# Patient Record
Sex: Female | Born: 1985 | Race: White | Hispanic: Yes | Marital: Married
Health system: Southern US, Community
[De-identification: ages and names within clinical notes are randomized; demographics above are authoritative.]

## PROBLEM LIST (undated history)

## (undated) DIAGNOSIS — F419 Anxiety disorder, unspecified: Secondary | ICD-10-CM

## (undated) DIAGNOSIS — D649 Anemia, unspecified: Secondary | ICD-10-CM

---

## 2007-12-19 ENCOUNTER — Inpatient Hospital Stay: Payer: Self-pay | Admitting: Obstetrics and Gynecology

## 2008-10-06 ENCOUNTER — Ambulatory Visit: Payer: Self-pay | Admitting: Family Medicine

## 2010-10-31 ENCOUNTER — Ambulatory Visit: Payer: Self-pay | Admitting: Obstetrics and Gynecology

## 2010-11-14 ENCOUNTER — Ambulatory Visit: Payer: Self-pay | Admitting: Surgery

## 2011-03-12 ENCOUNTER — Ambulatory Visit: Payer: Self-pay | Admitting: Advanced Practice Midwife

## 2011-04-03 ENCOUNTER — Encounter: Payer: Self-pay | Admitting: Family Medicine

## 2011-04-15 ENCOUNTER — Encounter: Payer: Self-pay | Admitting: Family Medicine

## 2011-05-01 ENCOUNTER — Observation Stay: Payer: Self-pay | Admitting: Obstetrics and Gynecology

## 2011-05-16 ENCOUNTER — Encounter: Payer: Self-pay | Admitting: Family Medicine

## 2011-08-27 ENCOUNTER — Inpatient Hospital Stay: Payer: Self-pay

## 2014-11-28 ENCOUNTER — Ambulatory Visit: Payer: Self-pay

## 2015-03-30 ENCOUNTER — Other Ambulatory Visit: Payer: Self-pay | Admitting: Certified Nurse Midwife

## 2015-03-30 DIAGNOSIS — N63 Unspecified lump in unspecified breast: Secondary | ICD-10-CM

## 2015-04-05 ENCOUNTER — Ambulatory Visit
Admit: 2015-04-05 | Disposition: A | Discharge status: Home / Self Care | Payer: Self-pay | Attending: Allergy | Admitting: Allergy

## 2015-05-31 ENCOUNTER — Ambulatory Visit: Payer: Self-pay

## 2015-12-28 ENCOUNTER — Encounter: Payer: Self-pay | Admitting: Physician Assistant

## 2015-12-28 ENCOUNTER — Ambulatory Visit: Payer: Self-pay | Admitting: Physician Assistant

## 2015-12-28 VITALS — BP 100/60 | HR 76 | Temp 98.4°F

## 2015-12-28 DIAGNOSIS — B80 Enterobiasis: Secondary | ICD-10-CM

## 2015-12-28 MED ORDER — ALBENDAZOLE 200 MG PO TABS: 400.0000 mg | ORAL_TABLET | Freq: Two times a day (BID) | ORAL | Status: DC

## 2015-12-28 NOTE — Progress Notes (Signed)
S: has anal itching, used a mirror and noticed a very small white worm, looked it up on internet and knows its a pinworm, children are not symptomatic at this time, denies abd pain or fever  O: vitals wnl, lungs c t a, cv rrr, rectal exam deferred by pt  A: pinworm infection by hx  P: albendazole 400mg ; rx for her husband Laurann [place] Lec[place]er also given, told her to have pediatrician issure rx for her children or try otc med; instructions on how to check stool for pinworms given

## 2016-01-15 ENCOUNTER — Other Ambulatory Visit: Payer: Self-pay | Admitting: Obstetrics and Gynecology

## 2016-01-15 DIAGNOSIS — N6310 Unspecified lump in the right breast, unspecified quadrant: Secondary | ICD-10-CM

## 2016-01-30 ENCOUNTER — Ambulatory Visit
Admission: RE | Admit: 2016-01-30 | Discharge: 2016-01-30 | Disposition: A | Discharge status: Home / Self Care | Payer: Managed Care, Other (non HMO) | Source: Ambulatory Visit | Attending: Obstetrics and Gynecology | Admitting: Obstetrics and Gynecology

## 2016-01-30 ENCOUNTER — Other Ambulatory Visit: Payer: Self-pay | Admitting: Obstetrics and Gynecology

## 2016-01-30 DIAGNOSIS — N6310 Unspecified lump in the right breast, unspecified quadrant: Secondary | ICD-10-CM

## 2016-01-30 DIAGNOSIS — R928 Other abnormal and inconclusive findings on diagnostic imaging of breast: Secondary | ICD-10-CM | POA: Insufficient documentation

## 2016-01-30 DIAGNOSIS — N63 Unspecified lump in breast: Secondary | ICD-10-CM | POA: Diagnosis present

## 2016-07-31 IMAGING — US US BREAST*L* LIMITED INC AXILLA
1 series · 4 of 4 positions shown · non-contrast
Comparison: 11/14/2010

CLINICAL DATA: 28-year-old female for follow-up of previously seen
right breast nodule at 10 o'clock. Surgical excision of the nodule
was recommended; however, at surgical consultation, the mass was
felt to most likely represent a fibroadenoma and follow-up was
recommended. The patient did not return for follow-up. A new nodule
is now felt by the patient's physician in the left breast at 12-2
o'clock.

EXAM:
DIGITAL DIAGNOSTIC  BILATERAL MAMMOGRAM WITH CAD
ULTRASOUND BILATERAL BREAST

[Series 1: us breast*left* limited inc axilla · 0.08mm/px · 4 of 4 slices shown]
[im 1/4]
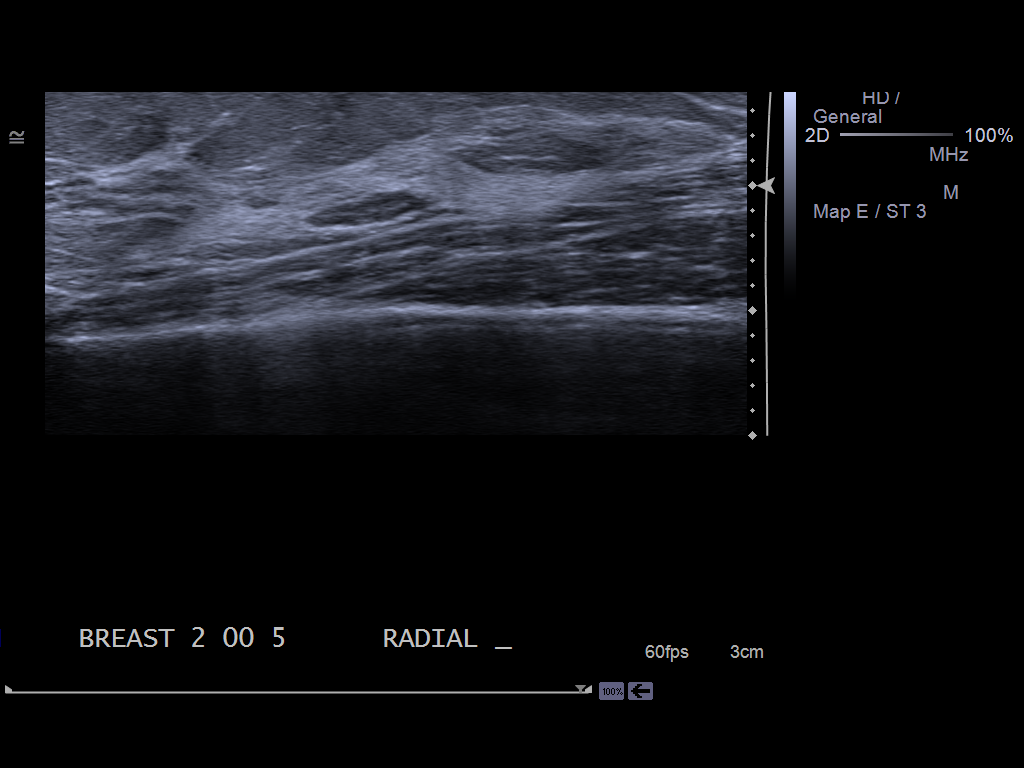
[im 2/4]
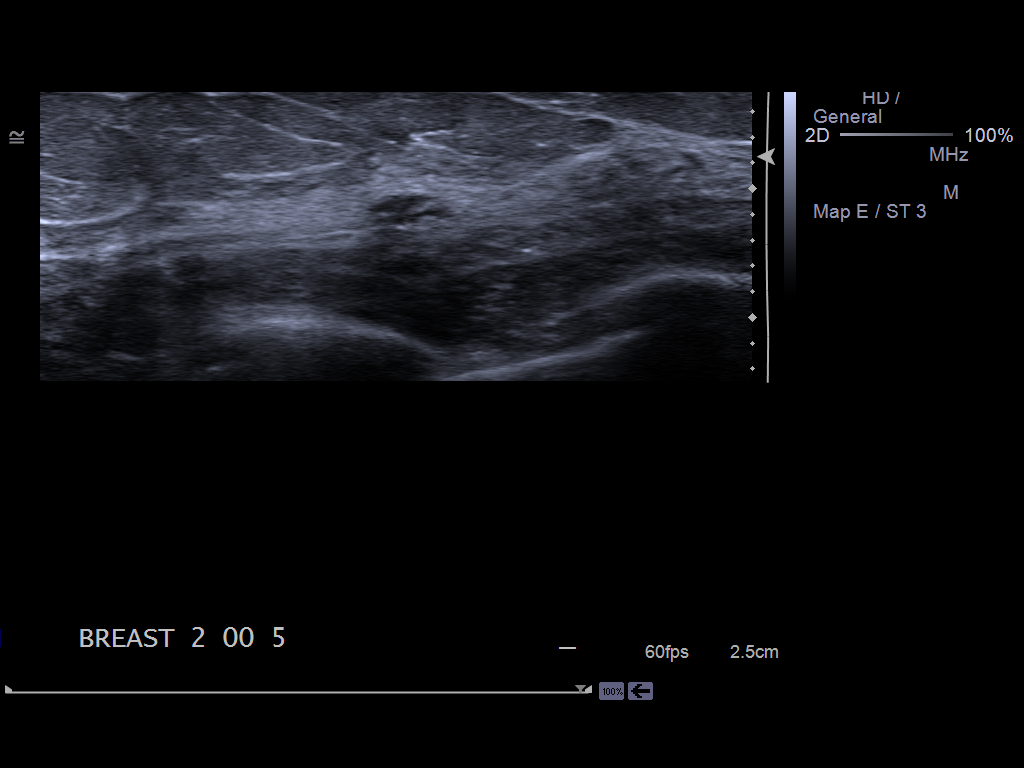
[im 3/4]
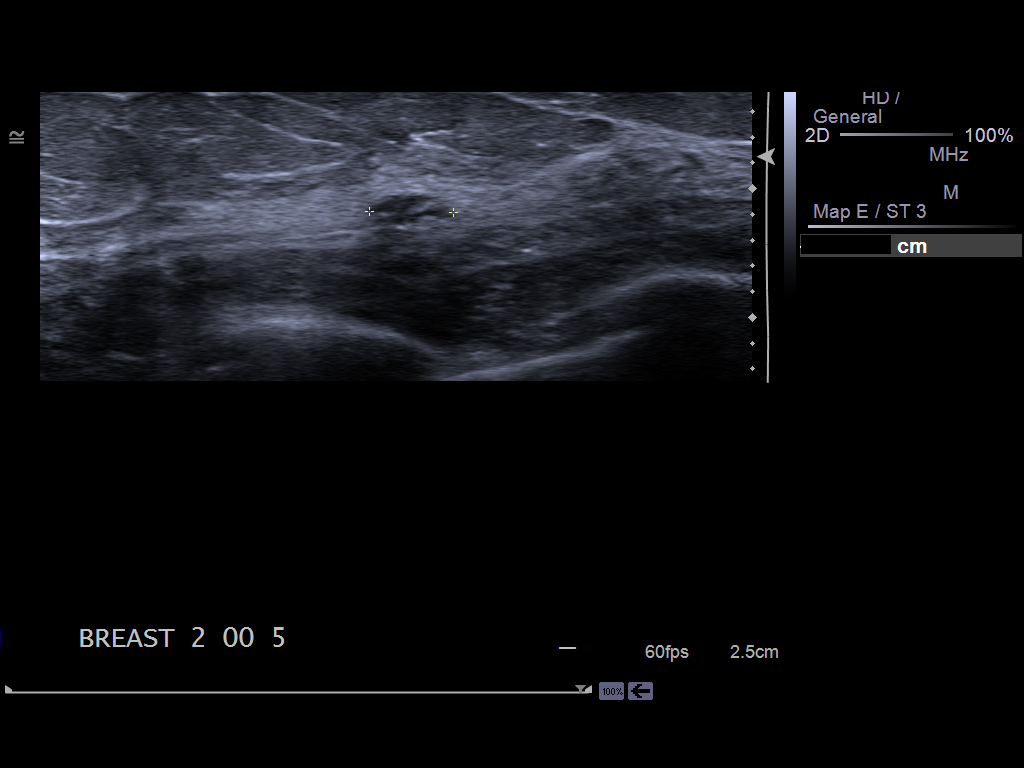
[im 4/4]
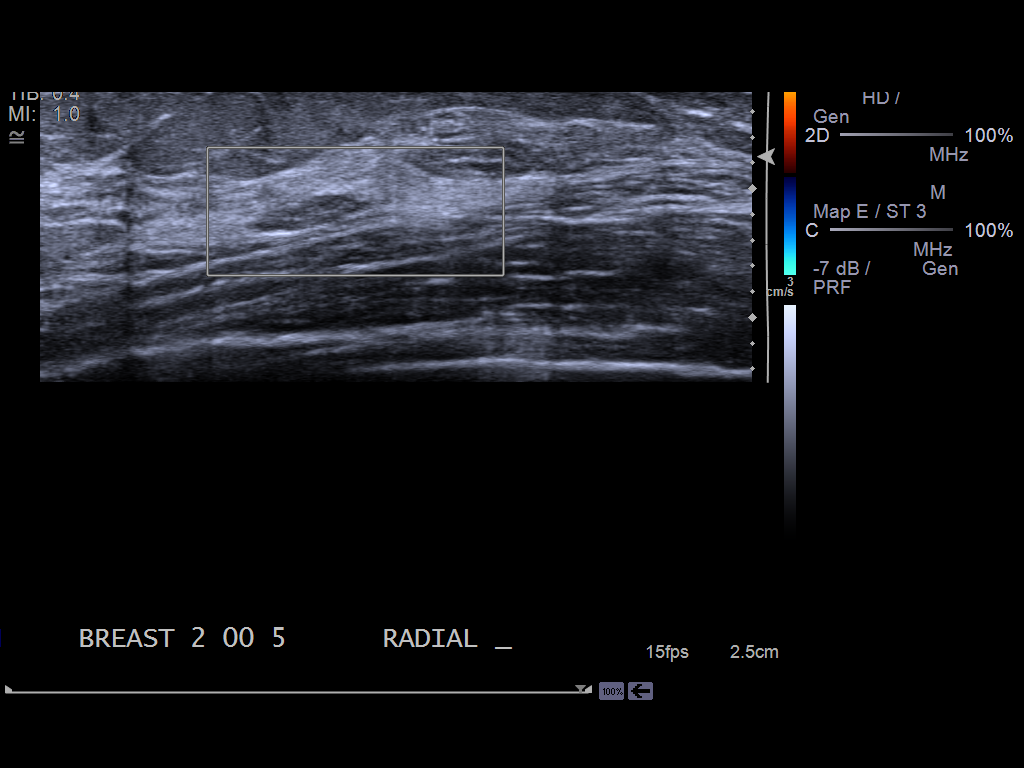

[4 of 4 positions shown; findings below may reference images not displayed]

ACR Breast Density Category c: The breast tissue is heterogeneously
dense, which may obscure small masses.
FINDINGS: No suspicious mass, calcifications, or other abnormality is
identified on mammogram.

Mammographic images were processed with CAD.

On physical exam, no discrete mass is felt within the upper, outer
quadrants of either breast.

Targeted ultrasound of the right breast demonstrates an oval,
circumscribed mass at 11 o'clock, 3 cm from the nipple measuring 9 x
4 x 8 mm. This mass likely represents the previously identified mass
at 10 o'clock; however, no distance from the nipple was noted on
prior ultrasound and no images labeled antiradial were obtained. No
additional mass was identified in this region.

Targeted ultrasound of the left breast from 12-2 o'clock
demonstrates an oval, hypoechoic, circumscribed mass at 2 o'clock, 5
cm from the nipple measuring 9 x 3 x 7 mm. No internal vascularity
is identified. This finding is likely incidental. No suspicious
sonographic finding was identified in this region.
IMPRESSION: Probably benign bilateral breast findings.

RECOMMENDATION:
Bilateral ultrasound in 6 months.

I have discussed the findings and recommendations with the patient.
Results were also provided in writing at the conclusion of the
visit. If applicable, a reminder letter will be sent to the patient
regarding the next appointment.

BI-RADS CATEGORY  3: Probably benign.

## 2016-07-31 IMAGING — US US BREAST*R* LIMITED INC AXILLA
1 series · 4 of 4 positions shown · non-contrast
Comparison: 11/14/2010

CLINICAL DATA: 28-year-old female for follow-up of previously seen
right breast nodule at 10 o'clock. Surgical excision of the nodule
was recommended; however, at surgical consultation, the mass was
felt to most likely represent a fibroadenoma and follow-up was
recommended. The patient did not return for follow-up. A new nodule
is now felt by the patient's physician in the left breast at 12-2
o'clock.

EXAM:
DIGITAL DIAGNOSTIC  BILATERAL MAMMOGRAM WITH CAD
ULTRASOUND BILATERAL BREAST

[Series 1: us breast*right* limited inc axilla · 0.08mm/px · 4 of 4 slices shown]
[im 1/4]
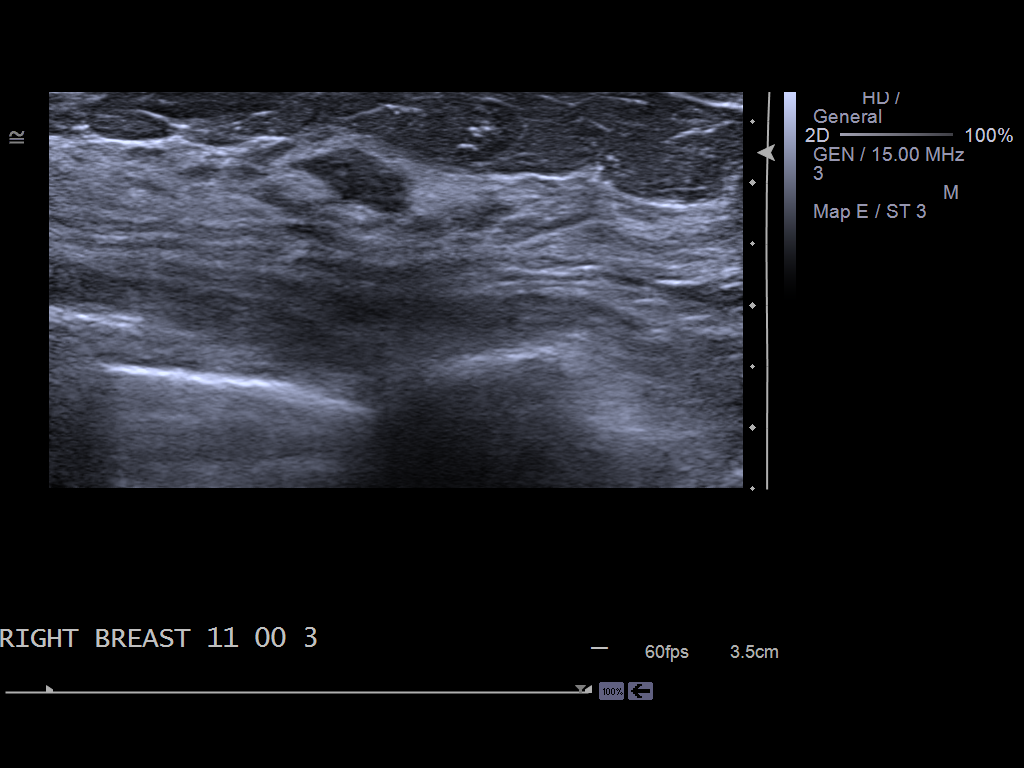
[im 2/4]
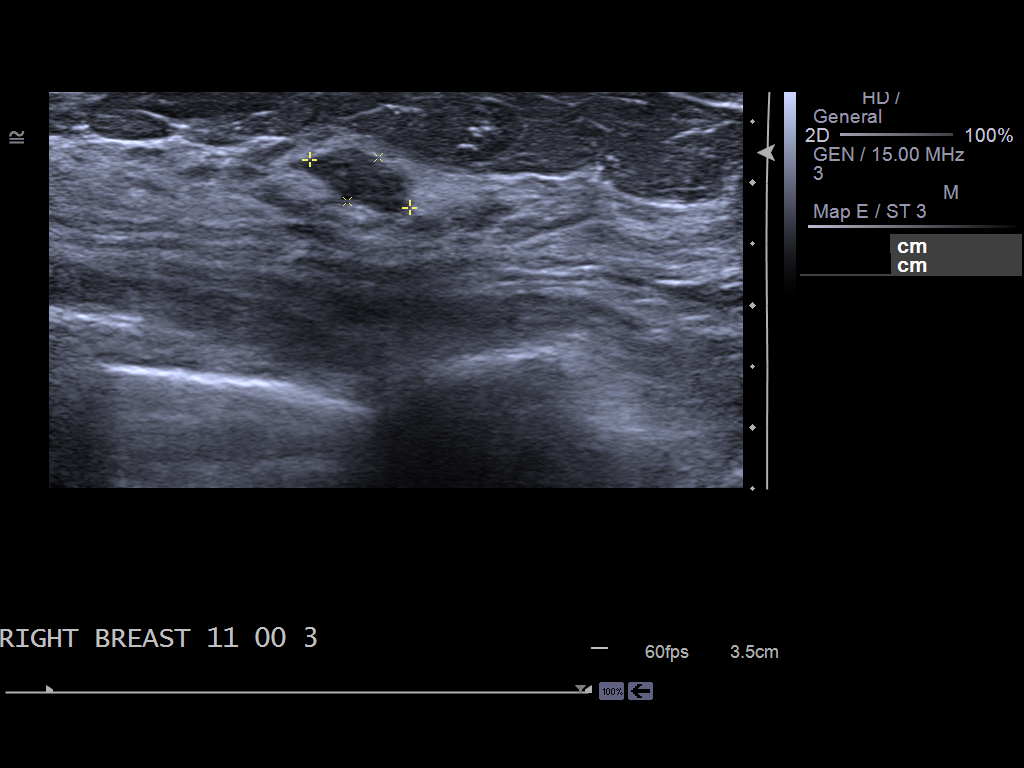
[im 3/4]
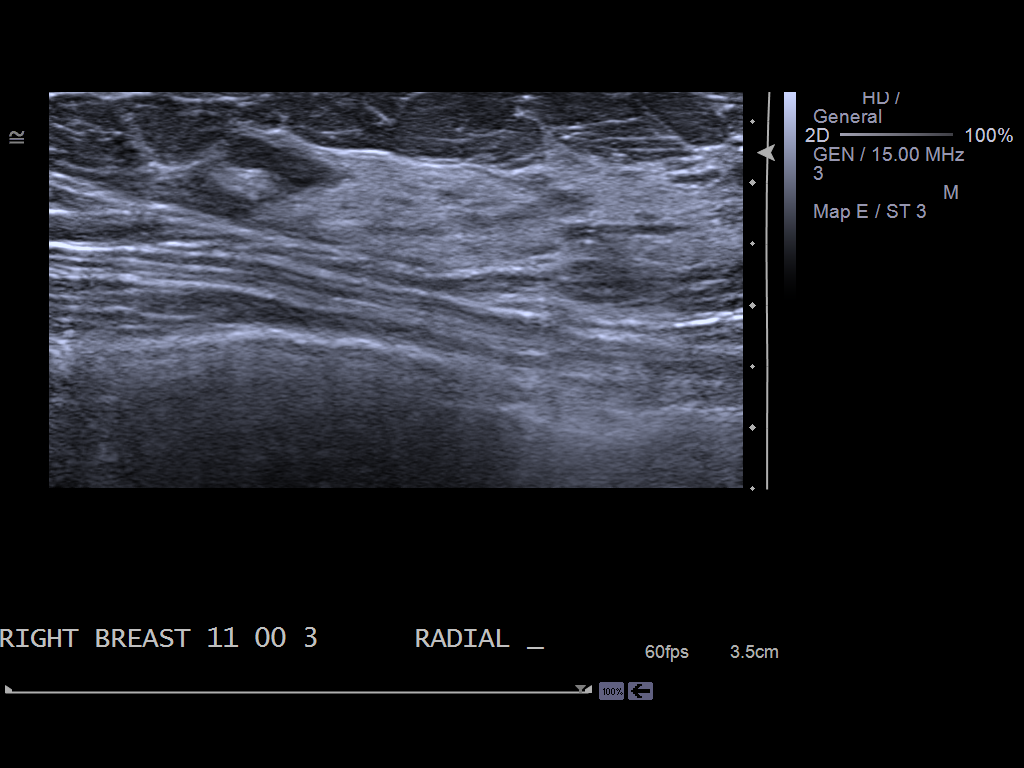
[im 4/4]
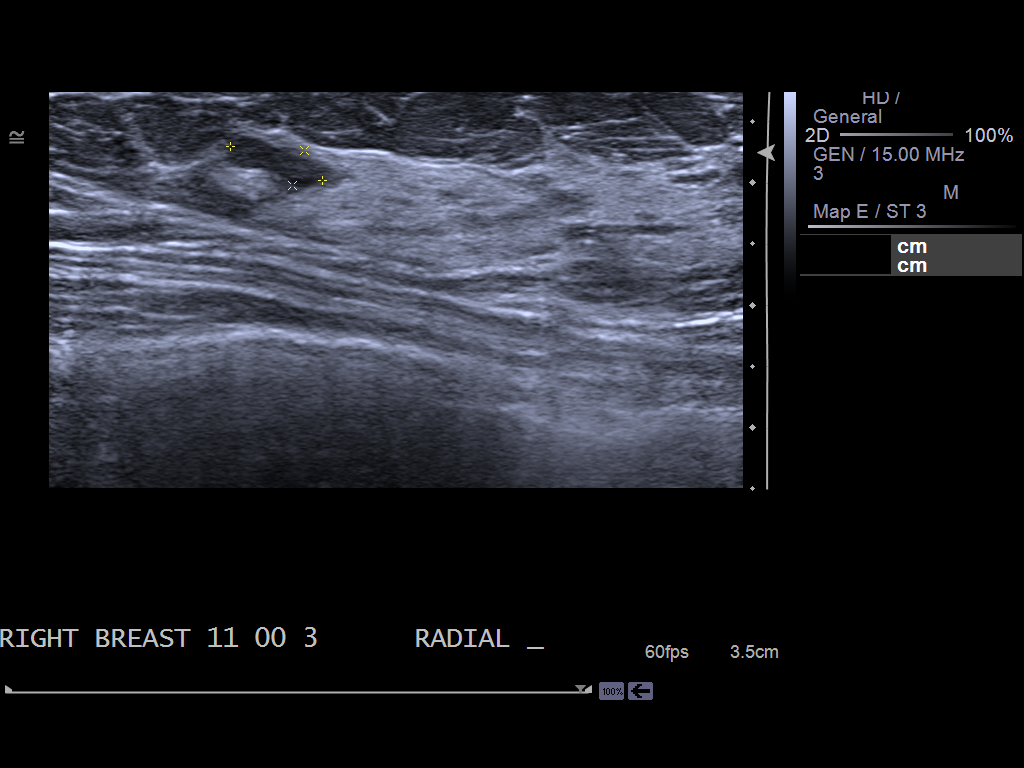

[4 of 4 positions shown; findings below may reference images not displayed]

ACR Breast Density Category c: The breast tissue is heterogeneously
dense, which may obscure small masses.
FINDINGS: No suspicious mass, calcifications, or other abnormality is
identified on mammogram.

Mammographic images were processed with CAD.

On physical exam, no discrete mass is felt within the upper, outer
quadrants of either breast.

Targeted ultrasound of the right breast demonstrates an oval,
circumscribed mass at 11 o'clock, 3 cm from the nipple measuring 9 x
4 x 8 mm. This mass likely represents the previously identified mass
at 10 o'clock; however, no distance from the nipple was noted on
prior ultrasound and no images labeled antiradial were obtained. No
additional mass was identified in this region.

Targeted ultrasound of the left breast from 12-2 o'clock
demonstrates an oval, hypoechoic, circumscribed mass at 2 o'clock, 5
cm from the nipple measuring 9 x 3 x 7 mm. No internal vascularity
is identified. This finding is likely incidental. No suspicious
sonographic finding was identified in this region.
IMPRESSION: Probably benign bilateral breast findings.

RECOMMENDATION:
Bilateral ultrasound in 6 months.

I have discussed the findings and recommendations with the patient.
Results were also provided in writing at the conclusion of the
visit. If applicable, a reminder letter will be sent to the patient
regarding the next appointment.

BI-RADS CATEGORY  3: Probably benign.

## 2016-08-07 ENCOUNTER — Encounter: Payer: Self-pay | Admitting: Physician Assistant

## 2016-08-07 ENCOUNTER — Ambulatory Visit: Payer: Self-pay | Admitting: Physician Assistant

## 2016-08-07 VITALS — BP 94/70 | HR 78 | Temp 98.6°F

## 2016-08-07 DIAGNOSIS — R519 Headache, unspecified: Secondary | ICD-10-CM

## 2016-08-07 DIAGNOSIS — J0100 Acute maxillary sinusitis, unspecified: Secondary | ICD-10-CM

## 2016-08-07 DIAGNOSIS — R51 Headache: Secondary | ICD-10-CM

## 2016-08-07 MED ORDER — AMOXICILLIN 875 MG PO TABS: 875.0000 mg | ORAL_TABLET | Freq: Two times a day (BID) | ORAL | 0 refills | Status: DC

## 2016-08-07 NOTE — Progress Notes (Signed)
S: C/o runny nose and congestion for 3 days, ? fever, chills last nigh, no  cp/sob, v/d; mucus is yellow and thick, cough is sporadic, c/o of facial and dental pain. C/o headache all of her head, no known tick bites, states headaches come and go x 3 months  Using otc meds:   O: PE: vitals wnl, nad, perrl eomi, normocephalic, tms dull, nasal mucosa red and swollen, throat injected, neck supple no lymph, lungs c t a, cv rrr, neuro intact  A:  Acute sinusitis, headache   P: drink fluids, continue regular meds , use otc meds of choice, return if not improving in 5 days, return earlier if worsening, amoxil 875mg  bid x 10d, if headaches continue can refer to neurology

## 2017-04-03 ENCOUNTER — Ambulatory Visit: Payer: Self-pay | Admitting: Physician Assistant

## 2017-04-03 ENCOUNTER — Encounter: Payer: Self-pay | Admitting: Physician Assistant

## 2017-04-03 VITALS — BP 110/70 | HR 100 | Temp 98.7°F

## 2017-04-03 DIAGNOSIS — J020 Streptococcal pharyngitis: Secondary | ICD-10-CM

## 2017-04-03 LAB — POCT RAPID STREP A (OFFICE): RAPID STREP A SCREEN: POSITIVE — AB

## 2017-04-03 MED ORDER — AMOXICILLIN 400 MG/5ML PO SUSR: ORAL | 0 refills | Status: DC

## 2017-04-03 MED ORDER — DEXAMETHASONE SODIUM PHOSPHATE 10 MG/ML IJ SOLN
10.0000 mg | Freq: Once | INTRAMUSCULAR | Status: AC
  Administered 2017-04-03: 10 mg via INTRAMUSCULAR

## 2017-04-03 NOTE — Progress Notes (Signed)
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

## 2017-04-06 ENCOUNTER — Encounter: Payer: Self-pay | Admitting: Physician Assistant

## 2017-04-06 ENCOUNTER — Ambulatory Visit: Payer: Self-pay | Admitting: Physician Assistant

## 2017-04-06 VITALS — BP 110/70 | Temp 98.7°F

## 2017-04-06 DIAGNOSIS — K1379 Other lesions of oral mucosa: Secondary | ICD-10-CM

## 2017-04-06 NOTE — Progress Notes (Signed)
S: pt had +strep test on Friday, given decadron  IM and liquid amoxicillin, states she does feel better but it still hurts to swallow, no fever/chills, states her uvula doesn't feel right  O: vitals wnl, nad, tms clear, throat with some exudate and uvula has dark area distally, neck supple no lymph, lungs c t a, cv rrr, pt has difficulty swallowing  A: necrosis of uvula  P: called ENT, [place] office said to make appointment with [place], when I asked to speak to a provider the front desk Neysa Bonito)  said to just call [place], sent a photo to Dr Sullivan Lone, he agreed area is necrotic, he tried to call ENT but unable to speak with anyone at this time, pt is to go to ENT for 345 appointment in [facility] with DR Irving Shows, if by chance we get in touch with a provider and they recommend IV antibiotics and steroids we will call the patient and set this up, she had plenty of airway upon discharge and is to go to ER if worsening prior to appointment

## 2017-12-04 ENCOUNTER — Ambulatory Visit: Payer: Self-pay | Admitting: Nurse Practitioner

## 2017-12-04 VITALS — BP 100/60 | HR 68 | Temp 98.5°F | Resp 16

## 2017-12-04 DIAGNOSIS — M25512 Pain in left shoulder: Secondary | ICD-10-CM

## 2017-12-04 MED ORDER — PREDNISONE 10 MG (21) PO TBPK: ORAL_TABLET | ORAL | 0 refills | Status: AC

## 2017-12-04 MED ORDER — BACLOFEN 10 MG PO TABS: 10.0000 mg | ORAL_TABLET | Freq: Three times a day (TID) | ORAL | 0 refills | Status: AC

## 2017-12-04 NOTE — Progress Notes (Signed)
Subjective:    Kelly Calderon is a 31 y.o. female who presents for evaluation of left arm/shoulder pain. The patient has had no prior arm or shoulder problems. Symptoms have been present for 1 day and are gradually worsening.  Onset was related to / precipitated by no known injury and was leaning trying to hold something with her left arm.. The pain is located in the anterior left shoulder and to left deltoid region.. The pain is described as sharp and occurs intermittently and depending on when or how she moves it.  Patient denies pain when at rest.. She rates her pain as mild. Symptoms are exacerbated by moving or raising her left upper extremity.. Symptoms are improved by acetaminophen, NSAIDs and stretching. She has also tried nothing which provided no symptom relief. She has no other symptoms associated with the left arm/shoulder pain. The patient has no "red flag" history indicative of complicated left upper extremity pain.  The following portions of the patient's history were reviewed and updated as appropriate: allergies, current medications and past medical history.  Review of Systems Constitutional: negative Eyes: negative Respiratory: negative Cardiovascular: negative Musculoskeletal:positive for arthralgias and muscle weakness, negative for neck pain Behavioral/Psych: negative    Objective:   Full range of motion without pain, no tenderness, no spasm, no curvature. Normal reflexes, gait, strength and negative straight-leg raise. Limited ROM to left upper xtremity.  Able to complete passive FROM.Marland Kitchen. Grip strength strong and equal bilaterally. +2 distal pulses. No crepitus or lesions bilaterally Lungs: clear to auscultation bilaterally Heart: regular rate and rhythm, S1, S2 normal, no murmur, click, rub or gallop Skin: Skin color, texture, turgor normal. No rashes or lesions    Assessment:    Left Shoulder Pain    Plan:    Stretching exercises discussed. Ice to affected area as  needed for local pain relief. Muscle relaxants per medication orders. Sterapred dose pack per orders.  If no relief, patient instructed to follow up with ortho.  Patient verbalizes understanding.

## 2018-03-17 ENCOUNTER — Other Ambulatory Visit: Payer: Self-pay

## 2018-03-18 NOTE — Progress Notes (Signed)
Gave TDap 03/17/18

## 2019-06-23 ENCOUNTER — Other Ambulatory Visit: Payer: Self-pay | Admitting: Family Medicine

## 2019-06-23 DIAGNOSIS — Z20822 Contact with and (suspected) exposure to covid-19: Secondary | ICD-10-CM

## 2019-06-29 LAB — NOVEL CORONAVIRUS, NAA: SARS-CoV-2, NAA: NOT DETECTED

## 2021-09-10 ENCOUNTER — Ambulatory Visit
Admission: RE | Admit: 2021-09-10 | Discharge: 2021-09-10 | Disposition: A | Discharge status: Home / Self Care | Payer: Managed Care, Other (non HMO) | Source: Ambulatory Visit | Attending: Obstetrics and Gynecology | Admitting: Obstetrics and Gynecology

## 2021-09-10 ENCOUNTER — Other Ambulatory Visit: Payer: Self-pay

## 2021-09-10 DIAGNOSIS — D509 Iron deficiency anemia, unspecified: Secondary | ICD-10-CM | POA: Diagnosis present

## 2021-09-10 HISTORY — DX: Anemia, unspecified: D64.9

## 2021-09-10 HISTORY — DX: Anxiety disorder, unspecified: F41.9

## 2021-09-10 MED ORDER — SODIUM CHLORIDE 0.9 % IV SOLN
300.0000 mg | INTRAVENOUS | Status: DC
  Administered 2021-09-10: 300 mg via INTRAVENOUS
  Filled 2021-09-10: qty 300

## 2021-09-17 ENCOUNTER — Ambulatory Visit
Admission: RE | Admit: 2021-09-17 | Discharge: 2021-09-17 | Disposition: A | Discharge status: Home / Self Care | Payer: Managed Care, Other (non HMO) | Source: Ambulatory Visit | Attending: Obstetrics and Gynecology | Admitting: Obstetrics and Gynecology

## 2021-09-17 ENCOUNTER — Other Ambulatory Visit: Payer: Self-pay

## 2021-09-17 DIAGNOSIS — D509 Iron deficiency anemia, unspecified: Secondary | ICD-10-CM | POA: Insufficient documentation

## 2021-09-17 MED ORDER — SODIUM CHLORIDE 0.9 % IV SOLN
300.0000 mg | Freq: Once | INTRAVENOUS | Status: AC
  Administered 2021-09-17: 300 mg via INTRAVENOUS
  Filled 2021-09-17: qty 300

## 2021-09-24 ENCOUNTER — Ambulatory Visit
Admission: RE | Admit: 2021-09-24 | Discharge: 2021-09-24 | Disposition: A | Discharge status: Home / Self Care | Payer: Managed Care, Other (non HMO) | Source: Ambulatory Visit | Attending: Obstetrics and Gynecology | Admitting: Obstetrics and Gynecology

## 2021-09-24 ENCOUNTER — Other Ambulatory Visit: Payer: Self-pay

## 2021-09-24 DIAGNOSIS — D509 Iron deficiency anemia, unspecified: Secondary | ICD-10-CM | POA: Diagnosis not present

## 2021-09-24 MED ORDER — SODIUM CHLORIDE 0.9 % IV SOLN
300.0000 mg | Freq: Once | INTRAVENOUS | Status: AC
  Administered 2021-09-24: 300 mg via INTRAVENOUS
  Filled 2021-09-24: qty 300

## 2021-10-01 ENCOUNTER — Ambulatory Visit
Admission: RE | Admit: 2021-10-01 | Discharge: 2021-10-01 | Disposition: A | Discharge status: Home / Self Care | Payer: Managed Care, Other (non HMO) | Source: Ambulatory Visit | Attending: Obstetrics and Gynecology | Admitting: Obstetrics and Gynecology

## 2021-10-01 ENCOUNTER — Other Ambulatory Visit: Payer: Self-pay

## 2021-10-01 DIAGNOSIS — D509 Iron deficiency anemia, unspecified: Secondary | ICD-10-CM | POA: Insufficient documentation

## 2021-10-01 MED ORDER — SODIUM CHLORIDE 0.9 % IV SOLN
300.0000 mg | Freq: Once | INTRAVENOUS | Status: AC
  Administered 2021-10-01: 300 mg via INTRAVENOUS
  Filled 2021-10-01: qty 300
